# Patient Record
Sex: Male | Born: 1990 | Race: Black or African American | Hispanic: No | Marital: Single | Smoking: Former smoker
Health system: Southern US, Community
[De-identification: ages and names within clinical notes are randomized; demographics above are authoritative.]

---

## 2018-01-23 ENCOUNTER — Emergency Department: Payer: Self-pay

## 2018-01-23 ENCOUNTER — Encounter: Payer: Self-pay | Admitting: Emergency Medicine

## 2018-01-23 ENCOUNTER — Emergency Department
Admission: EM | Admit: 2018-01-23 | Discharge: 2018-01-23 | Disposition: A | Discharge status: Home / Self Care | Payer: Self-pay | Attending: Emergency Medicine | Admitting: Emergency Medicine

## 2018-01-23 DIAGNOSIS — R079 Chest pain, unspecified: Secondary | ICD-10-CM

## 2018-01-23 DIAGNOSIS — R197 Diarrhea, unspecified: Secondary | ICD-10-CM

## 2018-01-23 DIAGNOSIS — R369 Urethral discharge, unspecified: Secondary | ICD-10-CM | POA: Insufficient documentation

## 2018-01-23 DIAGNOSIS — R0789 Other chest pain: Secondary | ICD-10-CM | POA: Insufficient documentation

## 2018-01-23 DIAGNOSIS — Z87891 Personal history of nicotine dependence: Secondary | ICD-10-CM | POA: Insufficient documentation

## 2018-01-23 LAB — TROPONIN I

## 2018-01-23 LAB — CBC WITH DIFFERENTIAL/PLATELET
BASOS ABS: 0 10*3/uL (ref 0–0.1)
Basophils Relative: 1 %
EOS PCT: 1 %
Eosinophils Absolute: 0 10*3/uL (ref 0–0.7)
HEMATOCRIT: 43 % (ref 40.0–52.0)
Hemoglobin: 13.7 g/dL (ref 13.0–18.0)
LYMPHS PCT: 27 %
Lymphs Abs: 0.8 10*3/uL — ABNORMAL LOW (ref 1.0–3.6)
MCH: 23.9 pg — AB (ref 26.0–34.0)
MCHC: 31.9 g/dL — ABNORMAL LOW (ref 32.0–36.0)
MCV: 74.8 fL — AB (ref 80.0–100.0)
Monocytes Absolute: 0.5 10*3/uL (ref 0.2–1.0)
Monocytes Relative: 16 %
NEUTROS ABS: 1.6 10*3/uL (ref 1.4–6.5)
Neutrophils Relative %: 55 %
PLATELETS: 196 10*3/uL (ref 150–440)
RBC: 5.75 MIL/uL (ref 4.40–5.90)
RDW: 14.2 % (ref 11.5–14.5)
WBC: 2.9 10*3/uL — AB (ref 3.8–10.6)

## 2018-01-23 LAB — URINALYSIS, COMPLETE (UACMP) WITH MICROSCOPIC
BACTERIA UA: NONE SEEN
Bilirubin Urine: NEGATIVE
GLUCOSE, UA: NEGATIVE mg/dL
HGB URINE DIPSTICK: NEGATIVE
Ketones, ur: NEGATIVE mg/dL
Leukocytes, UA: NEGATIVE
NITRITE: NEGATIVE
PROTEIN: 30 mg/dL — AB
SPECIFIC GRAVITY, URINE: 1.031 — AB (ref 1.005–1.030)
pH: 6 (ref 5.0–8.0)

## 2018-01-23 LAB — CHLAMYDIA/NGC RT PCR (ARMC ONLY)
CHLAMYDIA TR: NOT DETECTED
N gonorrhoeae: NOT DETECTED

## 2018-01-23 LAB — COMPREHENSIVE METABOLIC PANEL
ALBUMIN: 4.4 g/dL (ref 3.5–5.0)
ALK PHOS: 59 U/L (ref 38–126)
ALT: 16 U/L — AB (ref 17–63)
AST: 26 U/L (ref 15–41)
Anion gap: 8 (ref 5–15)
BILIRUBIN TOTAL: 1.1 mg/dL (ref 0.3–1.2)
BUN: 16 mg/dL (ref 6–20)
CALCIUM: 9.5 mg/dL (ref 8.9–10.3)
CO2: 26 mmol/L (ref 22–32)
CREATININE: 0.96 mg/dL (ref 0.61–1.24)
Chloride: 103 mmol/L (ref 101–111)
GFR calc Af Amer: >60 mL/min (ref >60)
GLUCOSE: 97 mg/dL (ref 65–99)
Potassium: 3.8 mmol/L (ref 3.5–5.1)
Sodium: 137 mmol/L (ref 135–145)
TOTAL PROTEIN: 8.2 g/dL — AB (ref 6.5–8.1)

## 2018-01-23 LAB — LIPASE, BLOOD: Lipase: 25 U/L (ref 11–51)

## 2018-01-23 MED ORDER — DIAZEPAM 2 MG PO TABS
2.0000 mg | ORAL_TABLET | Freq: Once | ORAL | Status: AC
  Administered 2018-01-23: 2 mg via ORAL

## 2018-01-23 MED ORDER — DIAZEPAM 2 MG PO TABS
ORAL_TABLET | ORAL | Status: AC
  Filled 2018-01-23: qty 1

## 2018-01-23 NOTE — ED Notes (Signed)
Patient transported to X-ray 

## 2018-01-23 NOTE — ED Triage Notes (Addendum)
Patient presents to ED via POV from home with multiple complaints. Patient reports recently being treated for an STD. Patient reports being prescribed doxycycline for 2 weeks but he only took 1 week worth. Patient also reports chest tenderness on palpation. Patient reports diarrhea as well. Pressured speech noted in triage. Patient also requesting a "colon check" because colon cancer runs in his family. Patient reports "I think I am having a stroke". NIH 0. VAN negative. Negative FAST exam. Even and non labored respirations noted. A&O x4.

## 2018-01-23 NOTE — ED Notes (Signed)
Patient presentation discussed with Don Perking MD. Patient is stable for flex care at this time.

## 2018-01-23 NOTE — ED Provider Notes (Signed)
[facility] Emergency Department Provider Note  ___________________________________________   First MD Initiated Contact with Patient 01/23/18 1401     (approximate)  I have reviewed the triage vital signs and the nursing notes.   HISTORY  Chief Complaint Other (Multiple Complaints)   HPI Bernard Jackson is a 27 y.o. male without any chronic medical issues who is presenting to the emergency department with several weeks of left-sided chest pain, diarrhea as well as penile discharge.  He says that would brought him in today was the left-sided chest pain which she describes as left upper outer portion of his chest and worse with movement.  He denies any nausea, vomiting or shortness of breath.  He says the pain comes and goes with anxiety.  He is here with his girlfriend he says that she sometimes will receive calls from him crying because of pain" getting worked up."  Patient says that he is already been to [facility] and was given doxycycline as well as Colace.  He says that he took the doxycycline for approximately 10 days and the penile discharge went away but did not complete the full 14-day course.  Says that he also has been having ongoing diarrhea over the past week and had a small streak of blood last week but no blood since.  Says that he is having loose, brown stool once a day.  Says that his grandmother had heart failure when she was 30.  Otherwise, no heart disease in the family or sudden death at a young age.  Patient says that he smokes marijuana daily but otherwise does not smoke cigarettes.  No other drinking or drug use.  History reviewed. No pertinent past medical history.  There are no active problems to display for this patient.   History reviewed. No pertinent surgical history.  Prior to Admission medications   Not on File    Allergies Patient has no known allergies.  No family history on file.  Social History Social History    Tobacco Use  . Smoking status: Former Smoker    Last attempt to quit: 11/23/2017    Years since quitting: 0.1  . Smokeless tobacco: Never Used  Substance Use Topics  . Alcohol use: Yes    Comment: "Not every day. 2x a week and on the weekends"  . Drug use: Yes    Types: Marijuana    Review of Systems  Constitutional: No fever/chills Eyes: No visual changes. ENT: No sore throat. Cardiovascular: As above Respiratory: Denies shortness of breath. Gastrointestinal: No abdominal pain.  No nausea, no vomiting. No constipation. Genitourinary: As above Musculoskeletal: Negative for back pain. Skin: Negative for rash. Neurological: Negative for headaches, focal weakness or numbness.   ____________________________________________   PHYSICAL EXAM:  VITAL SIGNS: ED Triage Vitals [01/23/18 1318]  Enc Vitals Group     BP (!) 155/96     Pulse Rate 92     Resp 16     Temp 99.7 F (37.6 C)     Temp Source Oral     SpO2 100 %     Weight 155 lb (70.3 kg)     Height  (1.727 m)     Head Circumference      Peak Flow      Pain Score      Pain Loc      Pain Edu?      Excl. in GC?     Constitutional: Alert and oriented. Well appearing and in  no acute distress.  Patient with pressured speech and becomes tearful during the exam. Eyes: Conjunctivae are normal.  Head: Atraumatic. Nose: No congestion/rhinnorhea. Mouth/Throat: Mucous membranes are moist.  Neck: No stridor.   Cardiovascular: Normal rate, regular rhythm. Grossly normal heart sounds.  Good peripheral circulation with equal and bilateral radial pulses.  Minimal tenderness to palpation to the left outer upper quadrant of the left pectoralis major muscle. Respiratory: Normal respiratory effort.  No retractions. Lungs CTAB. Gastrointestinal: Soft and nontender. No distention. No CVA tenderness. Musculoskeletal: No lower extremity tenderness nor edema.  No joint effusions. Neurologic:  Normal speech and language. No  gross focal neurologic deficits are appreciated. Skin:  Skin is warm, dry and intact. No rash noted. Psychiatric: Mood and affect are normal. Speech and behavior are normal.  ____________________________________________   LABS (all labs ordered are listed, but only abnormal results are displayed)  Labs Reviewed  URINALYSIS, COMPLETE (UACMP) WITH MICROSCOPIC - Abnormal; Notable for the following components:      Result Value   Color, Urine YELLOW (*)    APPearance CLEAR (*)    Specific Gravity, Urine 1.031 (*)    Protein, ur 30 (*)    All other components within normal limits  CBC WITH DIFFERENTIAL/PLATELET - Abnormal; Notable for the following components:   WBC 2.9 (*)    MCV 74.8 (*)    MCH 23.9 (*)    MCHC 31.9 (*)    Lymphs Abs 0.8 (*)    All other components within normal limits  CHLAMYDIA/NGC RT PCR ([facility] ONLY)  LIPASE, BLOOD  COMPREHENSIVE METABOLIC PANEL  TROPONIN I   ____________________________________________  EKG  ED ECG REPORT I, Arelia Longest, the attending physician, personally viewed and interpreted this ECG.   Date: 01/23/2018  EKG Time: 1418  Rate: 71  Rhythm: normal sinus rhythm  Axis: Normal  Intervals:none  ST&T Change: No ST segment elevation or depression.  No abnormal T wave inversion.  ____________________________________________  RADIOLOGY  No acute finding on the chest x-ray ____________________________________________   PROCEDURES  Procedure(s) performed:   Procedures  Critical Care performed:   ____________________________________________   INITIAL IMPRESSION / ASSESSMENT AND PLAN / ED COURSE  Pertinent labs & imaging results that were available during my care of the patient were reviewed by me and considered in my medical decision making (see chart for details).  Differential diagnosis includes, but is not limited to, ACS, aortic dissection, pulmonary embolism, cardiac tamponade, pneumothorax, pneumonia,  pericarditis, myocarditis, GI-related causes including esophagitis/gastritis, and musculoskeletal chest wall pain.   As part of my medical decision making, I reviewed the following data within the electronic MEDICAL RECORD NUMBERNo previous records on file for review.  Attempted to connect via care everywhere but care everywhere unable to find a matching record.  PE RC negative.  ----------------------------------------- 3:19 PM on 01/23/2018 -----------------------------------------  Patient resting without distress at this time.  Very reassuring work-up.  Ongoing symptoms for weeks to months.  I do not believe there is a life-threatening condition at this time.  Believe the patient follow-up in the office with a primary care doctor.  We discussed this as well as that his urine looks clear at this time.  He knows that he will receive a call if the gonorrhea or chlamydia comes back positive.  No further medications at this time.  He appears more calm since having the Valium.  No longer tearful.  Calm and cooperative.  Did not express any suicidal or homicidal ideation.  ____________________________________________   FINAL CLINICAL IMPRESSION(S) / ED DIAGNOSES  Chest pain.  Diarrhea.    NEW MEDICATIONS STARTED DURING THIS VISIT:  New Prescriptions   No medications on file     Note:  This document was prepared using Dragon voice recognition software and may include unintentional dictation errors.     Myrna Blazer, MD 01/23/18 1520

## 2018-01-23 NOTE — ED Notes (Signed)
First Nurse Note: Has had a chest pain for three weeks, He went to [facility] and they gave him medication it caused him to have diarrhea so he stopped taking it. States that his chest is still hurting.

## 2018-03-05 ENCOUNTER — Emergency Department
Admission: EM | Admit: 2018-03-05 | Discharge: 2018-03-05 | Disposition: A | Discharge status: Home / Self Care | Payer: Self-pay | Attending: Emergency Medicine | Admitting: Emergency Medicine

## 2018-03-05 ENCOUNTER — Other Ambulatory Visit: Payer: Self-pay

## 2018-03-05 ENCOUNTER — Encounter: Payer: Self-pay | Admitting: Emergency Medicine

## 2018-03-05 ENCOUNTER — Emergency Department: Payer: Self-pay

## 2018-03-05 DIAGNOSIS — R369 Urethral discharge, unspecified: Secondary | ICD-10-CM

## 2018-03-05 DIAGNOSIS — R079 Chest pain, unspecified: Secondary | ICD-10-CM | POA: Insufficient documentation

## 2018-03-05 DIAGNOSIS — Z87891 Personal history of nicotine dependence: Secondary | ICD-10-CM | POA: Insufficient documentation

## 2018-03-05 DIAGNOSIS — R360 Urethral discharge without blood: Secondary | ICD-10-CM | POA: Insufficient documentation

## 2018-03-05 LAB — CHLAMYDIA/NGC RT PCR (ARMC ONLY)
CHLAMYDIA TR: NOT DETECTED
N GONORRHOEAE: NOT DETECTED

## 2018-03-05 LAB — BASIC METABOLIC PANEL
ANION GAP: 9 (ref 5–15)
BUN: 18 mg/dL (ref 6–20)
CHLORIDE: 102 mmol/L (ref 101–111)
CO2: 26 mmol/L (ref 22–32)
Calcium: 10.1 mg/dL (ref 8.9–10.3)
Creatinine, Ser: 1.22 mg/dL (ref 0.61–1.24)
Glucose, Bld: 105 mg/dL — ABNORMAL HIGH (ref 65–99)
POTASSIUM: 4.5 mmol/L (ref 3.5–5.1)
SODIUM: 137 mmol/L (ref 135–145)

## 2018-03-05 LAB — URINALYSIS, COMPLETE (UACMP) WITH MICROSCOPIC
BACTERIA UA: NONE SEEN
BILIRUBIN URINE: NEGATIVE
Glucose, UA: NEGATIVE mg/dL
Hgb urine dipstick: NEGATIVE
Ketones, ur: NEGATIVE mg/dL
LEUKOCYTES UA: NEGATIVE
NITRITE: NEGATIVE
Protein, ur: NEGATIVE mg/dL
SPECIFIC GRAVITY, URINE: 1.027 (ref 1.005–1.030)
pH: 6 (ref 5.0–8.0)

## 2018-03-05 LAB — CBC
HEMATOCRIT: 45 % (ref 40.0–52.0)
Hemoglobin: 14.3 g/dL (ref 13.0–18.0)
MCH: 23.7 pg — ABNORMAL LOW (ref 26.0–34.0)
MCHC: 31.8 g/dL — ABNORMAL LOW (ref 32.0–36.0)
MCV: 74.7 fL — AB (ref 80.0–100.0)
PLATELETS: 227 10*3/uL (ref 150–440)
RBC: 6.02 MIL/uL — AB (ref 4.40–5.90)
RDW: 14.7 % — AB (ref 11.5–14.5)
WBC: 3.2 10*3/uL — AB (ref 3.8–10.6)

## 2018-03-05 LAB — TROPONIN I: Troponin I: <0.03 ng/mL (ref <0.03)

## 2018-03-05 NOTE — ED Notes (Signed)
Family member came out to nurses desk asking about wait. Appeared very disgruntled they have been in ED for so long. Informed her that one test has not resulted yet so that is what the doctor is waiting on. She walked back into room and cursing and yelling heard from room.

## 2018-03-05 NOTE — Discharge Instructions (Addendum)
Please make an appointment with a urologist for continued evaluation of your penis discharge.  Please make a primary care physician appointment for evaluation of your chest pain.  Return to the emergency department if you develop severe pain, shortness of breath, fever or chills, or any other symptoms concerning to you.

## 2018-03-05 NOTE — ED Provider Notes (Signed)
[facility] Emergency Department Provider Note  ____________________________________________  Time seen: Approximately 5:37 PM  I have reviewed the triage vital signs and the nursing notes.   HISTORY  Chief Complaint Penile Discharge and Chest Pain   HPI Bernard Jackson is a 27 y.o. male, otherwise healthy, presenting with penile discharge, chest pain.  The patient reports that for the past several months, he has had an intermittent penile discharge with dysuria and pain with ejaculation.  He has been treated for gonorrhea and chlamydia, multiple times and feels that his symptoms have improved but that he continues to have a penile discharge.  He reports that he looks at his penis after he urinates every single time and is able to see a drop on the tip of the urethra.  He denies any testicular scrotal pain, scrotal or testicular swelling, fevers or chills.  He reports that he has had multiple sexual partners in the past, but that everyone has been treated for gonorrhea and chlamydia as well, and that at this time he is only sexually active with a single person.  He also reports a daily left-sided chest pain without any radiation, palpitations, shortness of breath, diaphoresis, nausea or vomiting.  The patient has not seen a primary care physician or a urologist for his symptoms.  No past medical history on file.  There are no active problems to display for this patient.   History reviewed. No pertinent surgical history.    Allergies Patient has no known allergies.  No family history on file.  Social History Social History   Tobacco Use  . Smoking status: Former Smoker    Last attempt to quit: 11/23/2017    Years since quitting: 0.2  . Smokeless tobacco: Never Used  Substance Use Topics  . Alcohol use: Yes    Comment: "Not every day. 2x a week and on the weekends"  . Drug use: Yes    Types: Marijuana    Review of Systems Constitutional: No  fever/chills.  No lightheadedness or syncope. Eyes: No visual changes. ENT: No sore throat. No congestion or rhinorrhea. Cardiovascular: Positive chest pain. Denies palpitations. Respiratory: Denies shortness of breath.  No cough. Gastrointestinal: No abdominal pain.  No nausea, no vomiting.  No diarrhea.  No constipation. Genitourinary: Positive for penile discharge, dysuria, and pain with ejaculation.  Denies any hematuria or bloody ejaculate.  Denies any testicular or scrotal swelling or pain. Musculoskeletal: Negative for back pain. Skin: Negative for rash. Neurological: Negative for headaches. No focal numbness, tingling or weakness.     ____________________________________________   PHYSICAL EXAM:  VITAL SIGNS: ED Triage Vitals  Enc Vitals Group     BP 03/05/18 1335 (!) 151/107     Pulse Rate 03/05/18 1335 95     Resp 03/05/18 1335 20     Temp 03/05/18 1335 98.1 F (36.7 C)     Temp Source 03/05/18 1335 Oral     SpO2 03/05/18 1335 99 %     Weight 03/05/18 1337 150 lb (68 kg)     Height 03/05/18 1337 5\' 7"  (1.702 m)     Head Circumference --      Peak Flow --      Pain Score 03/05/18 1337 8     Pain Loc --      Pain Edu? --      Excl. in GC? --     Constitutional: Alert and oriented. Well appearing and in no acute distress. Answers questions appropriately. Eyes: Conjunctivae  are normal.  EOMI. No scleral icterus. Head: Atraumatic. Nose: No congestion/rhinnorhea. Mouth/Throat: Mucous membranes are moist.  Neck: No stridor.  Supple.  No JVD.  No meningismus. Cardiovascular: Normal rate, regular rhythm. No murmurs, rubs or gallops.  Respiratory: Normal respiratory effort.  No accessory muscle use or retractions. Lungs CTAB.  No wheezes, rales or ronchi. Gastrointestinal: Soft, nontender and nondistended.  No guarding or rebound.  No peritoneal signs. Genitourinary: Normal-appearing penis without any lesions.  The patient "Milks" his penis to show me discharge but I  can see a very minimal amount of what is likely urine in the urethral canal; there is no thick or discolored discharge.  The patient has normal lie of the testicles and scrotum, without any palpable masses or tenderness to palpation.  No inguinal hernias. Musculoskeletal: No LE edema. Neurologic:  A&Ox3.  Speech is clear.  Face and smile are symmetric.  EOMI.  Moves all extremities well. Skin:  Skin is warm, dry and intact. No rash noted. Psychiatric: The patient has a bizarre affect and a mildly depressed mood with occasional tearfulness.  ____________________________________________   LABS (all labs ordered are listed, but only abnormal results are displayed)  Labs Reviewed  BASIC METABOLIC PANEL - Abnormal; Notable for the following components:      Result Value   Glucose, Bld 105 (*)    All other components within normal limits  CBC - Abnormal; Notable for the following components:   WBC 3.2 (*)    RBC 6.02 (*)    MCV 74.7 (*)    MCH 23.7 (*)    MCHC 31.8 (*)    RDW 14.7 (*)    All other components within normal limits  CHLAMYDIA/NGC RT PCR ([facility] ONLY)  TROPONIN I  RPR  URINALYSIS, COMPLETE (UACMP) WITH MICROSCOPIC   ____________________________________________  EKG  ED ECG REPORT I, Rockne MenghiniNorman, Anne-Caroline, the attending physician, personally viewed and interpreted this ECG.   Date: 03/05/2018  EKG Time: 1347  Rate: 61  Rhythm: normal sinus rhythm; incomplete right bundle branch block.  Axis: normal  Intervals:none  ST&T Change: no STEMI  This EKG is compared to 01/24/2018 and is grossly unchanged  ____________________________________________  RADIOLOGY  Dg Chest 2 View  Result Date: 03/05/2018 CLINICAL DATA:  Chest discomfort for 2 months. EXAM: CHEST - 2 VIEW COMPARISON:  January 23, 2017 FINDINGS: No pneumothorax. The heart, hila, and mediastinum are normal. No pulmonary nodules or masses. No focal infiltrates. IMPRESSION: No active cardiopulmonary  disease. Electronically Signed   By: Gerome Samavid  Williams III M.D   On: 03/05/2018 14:47    ____________________________________________   PROCEDURES  Procedure(s) performed: None  Procedures  Critical Care performed: No ____________________________________________   INITIAL IMPRESSION / ASSESSMENT AND PLAN / ED COURSE  Pertinent labs & imaging results that were available during my care of the patient were reviewed by me and considered in my medical decision making (see chart for details).  27 y.o. male presenting for penile discharge and chest pain.  Overall, it is unclear that the patient has an active urethral infection, and his symptoms are not consistent with prostatitis.  Will check for gonorrhea and chlamydia, but if he is negative, will no treatment is warranted at this time.  We will also evaluate for UTI.  An RPR will be sent and will likely need to be followed up as an outpatient.  The patient's chest pain is nonspecific, his EKG is grossly unchanged, and his troponin is not elevated.  His chest x-ray  does not show any acute cardiopulmonary process.  The etiology of the patient's chest pain is unclear, but there is no evidence for ACS or MI, arrhythmia, today.  I have talked to the patient about PCP follow-up as well as urology follow-up.  He is concerned about his lack of insurance and I will give him the numbers for the clinics which see patients that do not have medical insurance.  ____________________________________________  FINAL CLINICAL IMPRESSION(S) / ED DIAGNOSES  Final diagnoses:  Penile discharge, without blood  Chest pain, unspecified type         NEW MEDICATIONS STARTED DURING THIS VISIT:  New Prescriptions   No medications on file      Rockne Menghini, MD 03/05/18 1801

## 2018-03-05 NOTE — ED Notes (Signed)
Pt states he was recently treated for STDs and took all prescribed medications. Pt states he still has discharge and is hurting all over. Pt thinks he may have prostate cancer and is concerned.

## 2018-03-05 NOTE — ED Notes (Signed)
Esign not working at this time. Pt verbalized discharge instructions and all questions answered

## 2018-03-05 NOTE — ED Triage Notes (Addendum)
Clear or white penile discharge x 1 month. Denies burning. During triage also states he has L chest pain since yesterday.

## 2018-03-06 LAB — RPR: RPR Ser Ql: NONREACTIVE

## 2018-10-18 ENCOUNTER — Emergency Department
Admission: EM | Admit: 2018-10-18 | Discharge: 2018-10-18 | Disposition: A | Discharge status: Home / Self Care | Payer: Self-pay | Attending: Emergency Medicine | Admitting: Emergency Medicine

## 2018-10-18 ENCOUNTER — Other Ambulatory Visit: Payer: Self-pay

## 2018-10-18 DIAGNOSIS — R1110 Vomiting, unspecified: Secondary | ICD-10-CM

## 2018-10-18 DIAGNOSIS — R109 Unspecified abdominal pain: Secondary | ICD-10-CM | POA: Insufficient documentation

## 2018-10-18 DIAGNOSIS — R112 Nausea with vomiting, unspecified: Secondary | ICD-10-CM | POA: Insufficient documentation

## 2018-10-18 DIAGNOSIS — Z87891 Personal history of nicotine dependence: Secondary | ICD-10-CM | POA: Insufficient documentation

## 2018-10-18 LAB — COMPREHENSIVE METABOLIC PANEL
ALT: 32 U/L (ref 0–44)
AST: 34 U/L (ref 15–41)
Albumin: 4.7 g/dL (ref 3.5–5.0)
Alkaline Phosphatase: 73 U/L (ref 38–126)
Anion gap: 10 (ref 5–15)
BUN: 22 mg/dL — ABNORMAL HIGH (ref 6–20)
CO2: 23 mmol/L (ref 22–32)
Calcium: 9.6 mg/dL (ref 8.9–10.3)
Chloride: 105 mmol/L (ref 98–111)
Creatinine, Ser: 1.17 mg/dL (ref 0.61–1.24)
GFR calc non Af Amer: >60 mL/min (ref >60)
Glucose, Bld: 110 mg/dL — ABNORMAL HIGH (ref 70–99)
Potassium: 4.1 mmol/L (ref 3.5–5.1)
Sodium: 138 mmol/L (ref 135–145)
Total Bilirubin: 1.1 mg/dL (ref 0.3–1.2)
Total Protein: 8 g/dL (ref 6.5–8.1)

## 2018-10-18 LAB — CBC WITH DIFFERENTIAL/PLATELET
ABS IMMATURE GRANULOCYTES: 0.08 10*3/uL — AB (ref 0.00–0.07)
Basophils Absolute: 0 10*3/uL (ref 0.0–0.1)
Basophils Relative: 0 %
Eosinophils Absolute: 0 10*3/uL (ref 0.0–0.5)
Eosinophils Relative: 0 %
HCT: 44 % (ref 39.0–52.0)
Hemoglobin: 13.4 g/dL (ref 13.0–17.0)
Immature Granulocytes: 1 %
Lymphocytes Relative: 9 %
Lymphs Abs: 0.9 10*3/uL (ref 0.7–4.0)
MCH: 22.6 pg — ABNORMAL LOW (ref 26.0–34.0)
MCHC: 30.5 g/dL (ref 30.0–36.0)
MCV: 74.2 fL — ABNORMAL LOW (ref 80.0–100.0)
Monocytes Absolute: 0.7 10*3/uL (ref 0.1–1.0)
Monocytes Relative: 7 %
NRBC: 0 % (ref 0.0–0.2)
Neutro Abs: 8.2 10*3/uL — ABNORMAL HIGH (ref 1.7–7.7)
Neutrophils Relative %: 83 %
Platelets: 205 10*3/uL (ref 150–400)
RBC: 5.93 MIL/uL — ABNORMAL HIGH (ref 4.22–5.81)
RDW: 14.2 % (ref 11.5–15.5)
WBC: 9.9 10*3/uL (ref 4.0–10.5)

## 2018-10-18 MED ORDER — DICYCLOMINE HCL 10 MG PO CAPS
20.0000 mg | ORAL_CAPSULE | Freq: Once | ORAL | Status: AC
  Administered 2018-10-18: 20 mg via ORAL
  Filled 2018-10-18: qty 2

## 2018-10-18 MED ORDER — FAMOTIDINE 20 MG PO TABS: 20.0000 mg | ORAL_TABLET | Freq: Every day | ORAL | 1 refills | Status: AC

## 2018-10-18 MED ORDER — SODIUM CHLORIDE 0.9 % IV BOLUS
1000.0000 mL | Freq: Once | INTRAVENOUS | Status: AC
  Administered 2018-10-18: 1000 mL via INTRAVENOUS

## 2018-10-18 MED ORDER — ONDANSETRON HCL 4 MG/2ML IJ SOLN
4.0000 mg | Freq: Once | INTRAMUSCULAR | Status: AC
  Administered 2018-10-18: 4 mg via INTRAVENOUS
  Filled 2018-10-18: qty 2

## 2018-10-18 MED ORDER — DICYCLOMINE HCL 20 MG PO TABS: 20.0000 mg | ORAL_TABLET | Freq: Three times a day (TID) | ORAL | 0 refills | Status: DC | PRN

## 2018-10-18 MED ORDER — SUCRALFATE 1 G PO TABS: 1.0000 g | ORAL_TABLET | Freq: Four times a day (QID) | ORAL | 0 refills | Status: AC

## 2018-10-18 NOTE — ED Notes (Signed)
Grn/Lav/Red tubes sent to lab.

## 2018-10-18 NOTE — ED Provider Notes (Signed)
[facility] Emergency Department Provider Note  ____________________________________________   I have reviewed the triage vital signs and the nursing notes.   HISTORY  Chief Complaint Emesis and Hematemesis   History limited by: Not Limited   HPI Rydge Greig is a 28 y.o. male who presents to the emergency department today via EMS because of concerns for continued abdominal pain, nausea vomiting and diarrhea.  Patient states that he has had issues with his stomach for months and months.  He has seen a GI specialist for this and has had multiple visits to the emergency department.  He states he came in today because he noticed some blood in his stool.  He states this has happened to him before.  He is also noticed blood in his vomit from time to time.  The patient states he is not currently taking any medications for his abdominal discomfort.  He denies any fevers.  Per medical record review patient has a history of ER visits in the past for similar symptoms  History reviewed. No pertinent past medical history.  There are no active problems to display for this patient.   History reviewed. No pertinent surgical history.  Prior to Admission medications   Not on File    Allergies Patient has no known allergies.  History reviewed. No pertinent family history.  Social History Social History   Tobacco Use  . Smoking status: Former Smoker    Last attempt to quit: 11/23/2017    Years since quitting: 0.9  . Smokeless tobacco: Never Used  Substance Use Topics  . Alcohol use: Yes    Comment: "Not every day. 2x a week and on the weekends"  . Drug use: Yes    Types: Marijuana    Review of Systems Constitutional: No fever/chills Eyes: No visual changes. ENT: No sore throat. Cardiovascular: Denies chest pain. Respiratory: Denies shortness of breath. Gastrointestinal: Positive for abdominal pain, nausea, vomiting and diarrhea Genitourinary: Negative  for dysuria. Musculoskeletal: Negative for back pain. Skin: Negative for rash. Neurological: Negative for headaches, focal weakness or numbness.  ____________________________________________   PHYSICAL EXAM:  VITAL SIGNS: ED Triage Vitals  Enc Vitals Group     BP 10/18/18 1800 (!) 113/92     Pulse Rate 10/18/18 1800 73     Resp 10/18/18 1816 16     Temp 10/18/18 1800 99.7 F (37.6 C)     Temp Source 10/18/18 1800 Oral     SpO2 10/18/18 1757 99 %     Weight 10/18/18 1805 195 lb (88.5 kg)     Height 10/18/18 1805 5\' 7"  (1.702 m)     Head Circumference --      Peak Flow --      Pain Score 10/18/18 1802 10   Constitutional: Alert and oriented.  Eyes: Conjunctivae are normal.  ENT      Head: Normocephalic and atraumatic.      Nose: No congestion/rhinnorhea.      Mouth/Throat: Mucous membranes are moist.      Neck: No stridor. Hematological/Lymphatic/Immunilogical: No cervical lymphadenopathy. Cardiovascular: Normal rate, regular rhythm.  No murmurs, rubs, or gallops.  Respiratory: Normal respiratory effort without tachypnea nor retractions. Breath sounds are clear and equal bilaterally. No wheezes/rales/rhonchi. Gastrointestinal: Soft and non tender. No rebound. No guarding.  Genitourinary: Deferred Musculoskeletal: Normal range of motion in all extremities. No lower extremity edema. Neurologic:  Normal speech and language. No gross focal neurologic deficits are appreciated.  Skin:  Skin is warm, dry and  intact. No rash noted. Psychiatric: Mood and affect are normal. Speech and behavior are normal. Patient exhibits appropriate insight and judgment.  ____________________________________________    LABS (pertinent positives/negatives)  CMP wnl except glu 110, BUn 22 CBC wbc 9.9, hgb 13.4, plt 205  ____________________________________________   EKG  None  ____________________________________________     RADIOLOGY  None   ____________________________________________   PROCEDURES  Procedures  ____________________________________________   INITIAL IMPRESSION / ASSESSMENT AND PLAN / ED COURSE  Pertinent labs & imaging results that were available during my care of the patient were reviewed by me and considered in my medical decision making (see chart for details).   Patient presented to the emergency department today because of concerns for nausea vomiting and diarrhea he did notice some blood in this.  Patient states he has a history of IBS.  Patient's blood work today without concerning leukocytosis or anemia.  He did feel somewhat better after medications.  At this point I do think is reasonable for patient be discharged home.  Discussed with patient's importance of continued follow-up with his GI doctor.   ____________________________________________   FINAL CLINICAL IMPRESSION(S) / ED DIAGNOSES  Final diagnoses:  Vomiting, intractability of vomiting not specified, presence of nausea not specified, unspecified vomiting type  Abdominal pain, unspecified abdominal location     Note: This dictation was prepared with Dragon dictation. Any transcriptional errors that result from this process are unintentional     Phineas SemenGoodman, Emarion Toral, MD 10/19/18 1730

## 2018-10-18 NOTE — ED Notes (Addendum)
Pt states recently experiencing episodes of pain when swallowing and weight on his chest. EKG completed. Pt denies heavy alcohol use.

## 2018-10-18 NOTE — ED Triage Notes (Signed)
Pt in via Ems from home d/t bloody N/V x3 and bloody stool. Given 4 of Zofran by EMS through 20g R ac.

## 2018-10-18 NOTE — Discharge Instructions (Addendum)
Please seek medical attention for any high fevers, chest pain, shortness of breath, change in behavior, persistent vomiting, bloody stool or any other new or concerning symptoms.  

## 2019-02-19 ENCOUNTER — Other Ambulatory Visit: Payer: Self-pay

## 2019-02-19 ENCOUNTER — Emergency Department: Admission: EM | Admit: 2019-02-19 | Discharge: 2019-02-19 | Discharge status: Absent without leave | Payer: Self-pay

## 2019-12-28 IMAGING — CR DG CHEST 2V
1 series · 2 of 2 positions shown · non-contrast
Comparison: January 23, 2017

CLINICAL DATA: Chest discomfort for 2 months.

EXAM:
CHEST - 2 VIEW

[Series 1: dg chest 2 view · 0.14mm/px · 2 of 2 slices shown]
[im 1/2]
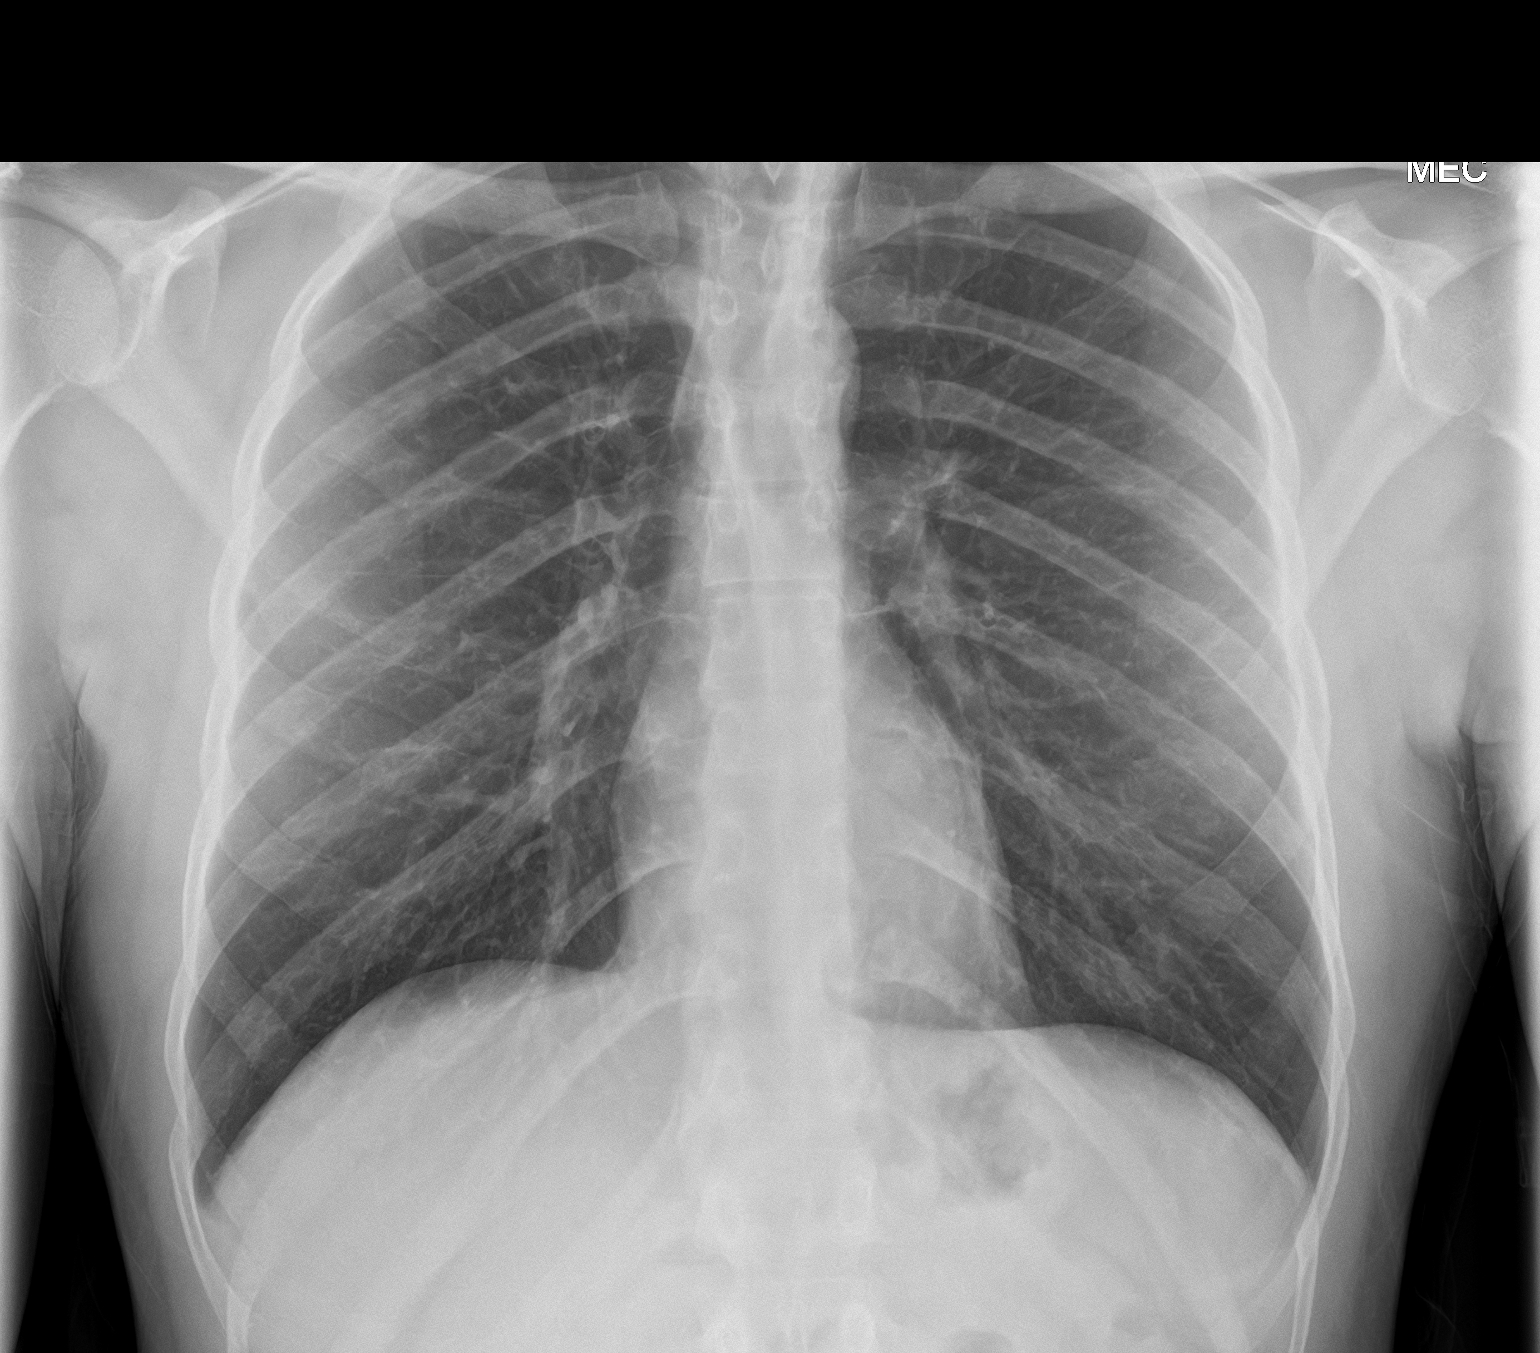
[im 2/2]
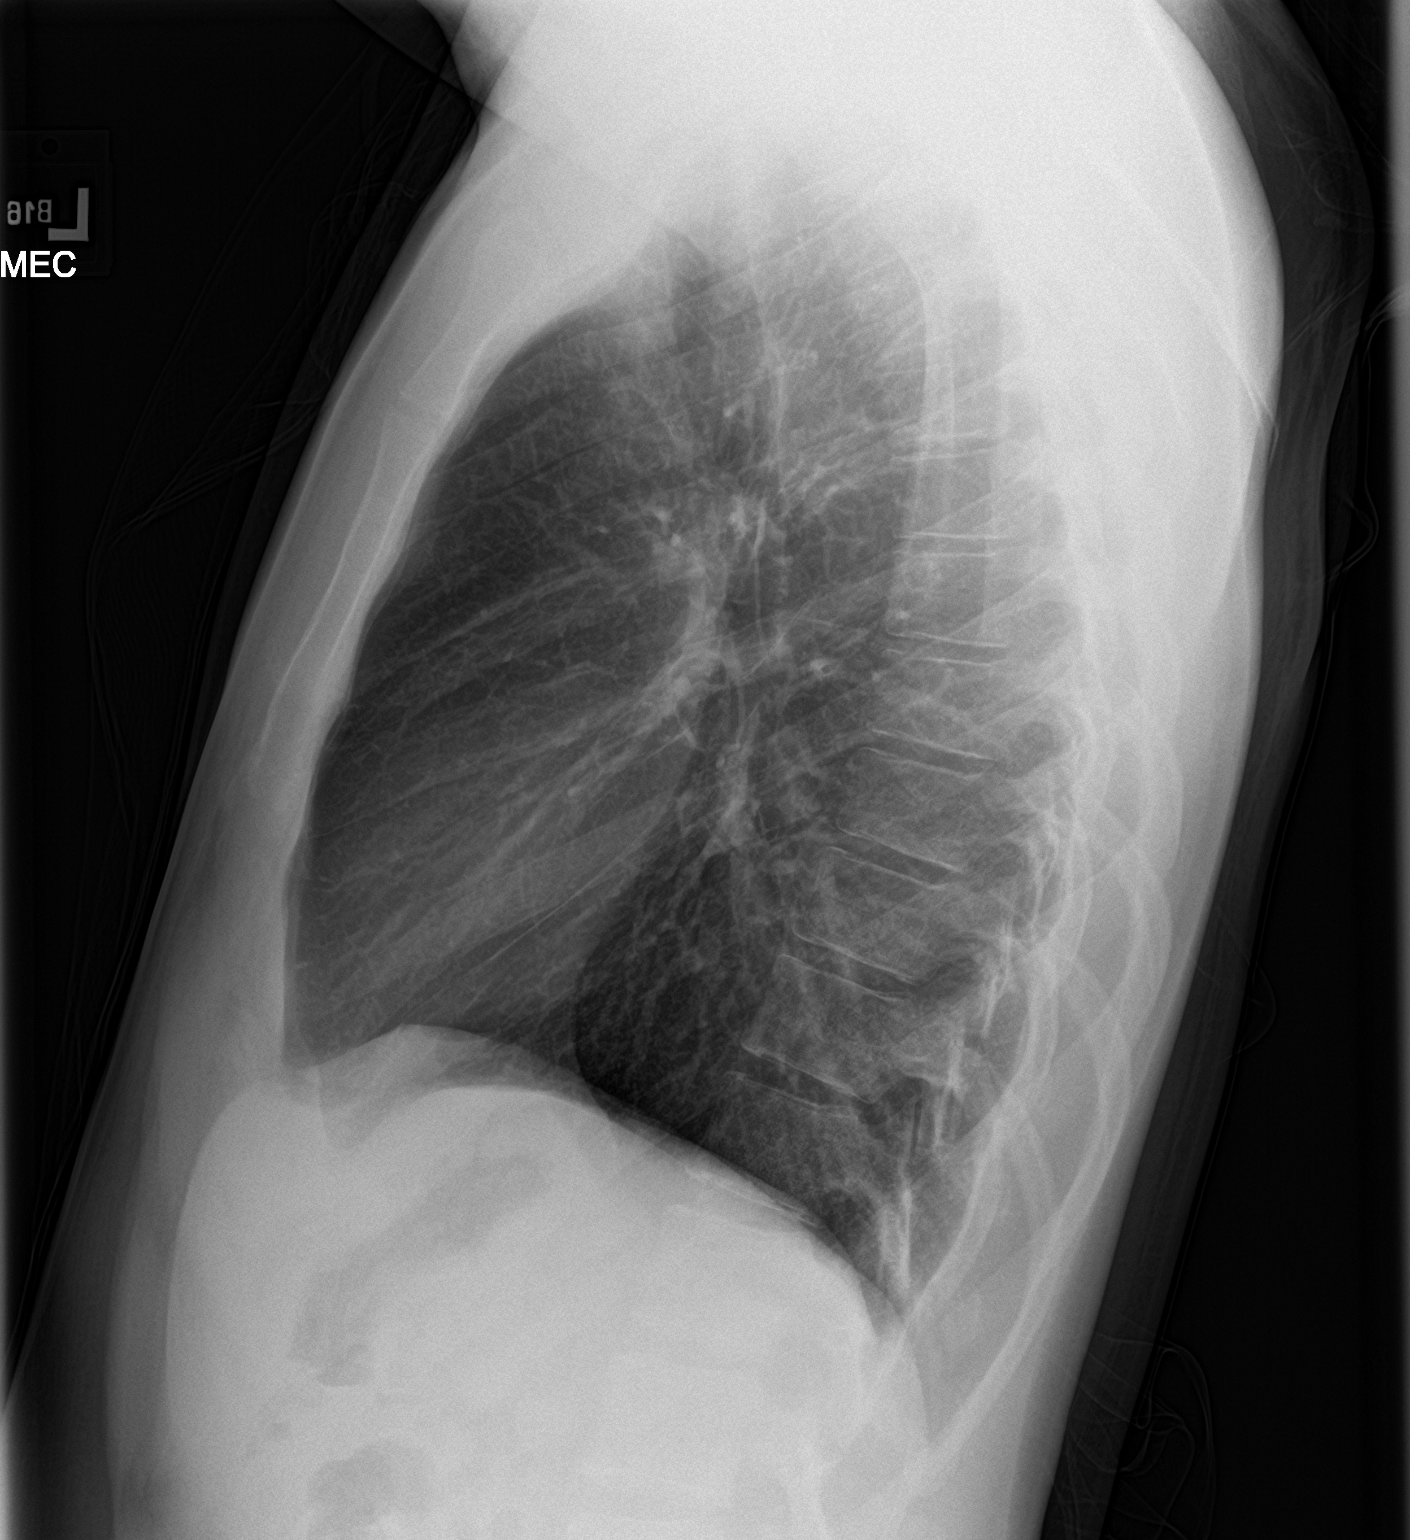

[2 of 2 positions shown; findings below may reference images not displayed]

FINDINGS: No pneumothorax. The heart, hila, and mediastinum are normal. No
pulmonary nodules or masses. No focal infiltrates.
IMPRESSION: No active cardiopulmonary disease.

## 2022-06-20 ENCOUNTER — Other Ambulatory Visit: Payer: Self-pay

## 2022-06-20 ENCOUNTER — Encounter: Payer: Self-pay | Admitting: Emergency Medicine

## 2022-06-20 ENCOUNTER — Emergency Department
Admission: EM | Admit: 2022-06-20 | Discharge: 2022-06-20 | Disposition: A | Discharge status: Home / Self Care | Payer: Self-pay | Attending: Emergency Medicine | Admitting: Emergency Medicine

## 2022-06-20 DIAGNOSIS — K029 Dental caries, unspecified: Secondary | ICD-10-CM | POA: Insufficient documentation

## 2022-06-20 DIAGNOSIS — K0889 Other specified disorders of teeth and supporting structures: Secondary | ICD-10-CM

## 2022-06-20 MED ORDER — AMOXICILLIN 500 MG PO CAPS: 500.0000 mg | ORAL_CAPSULE | Freq: Three times a day (TID) | ORAL | 0 refills | Status: AC

## 2022-06-20 MED ORDER — NAPROXEN 500 MG PO TABS
250.0000 mg | ORAL_TABLET | Freq: Once | ORAL | Status: AC
  Administered 2022-06-20: 250 mg via ORAL
  Filled 2022-06-20: qty 1

## 2022-06-20 MED ORDER — AMOXICILLIN 500 MG PO CAPS
500.0000 mg | ORAL_CAPSULE | Freq: Once | ORAL | Status: AC
  Administered 2022-06-20: 500 mg via ORAL
  Filled 2022-06-20: qty 1

## 2022-06-20 MED ORDER — NAPROXEN 250 MG PO TABS: 500.0000 mg | ORAL_TABLET | Freq: Two times a day (BID) | ORAL | 0 refills | Status: AC

## 2022-06-20 NOTE — ED Provider Notes (Signed)
[facility] Provider Note    Event Date/Time   First MD Initiated Contact with Patient 06/20/22 [phone]     (approximate)   History   Dental Pain   HPI  Bernard Jackson is a 31 y.o. male with no significant past medical history presents with dental pain.  Symptoms started last night.  Pain is in the left upper molar.  Has had pain in this tooth before that had come and gone but has not been this severe.  Denies facial swelling or trismus.  No fevers or chills.  Has not taken anything over-the-counter for it.  Has not seen a dentist recently and does not have a dentist.     History reviewed. No pertinent past medical history.  There are no problems to display for this patient.    Physical Exam  Triage Vital Signs: ED Triage Vitals  Enc Vitals Group     BP 06/20/22 0644 (!) 142/77     Pulse Rate 06/20/22 0644 64     Resp 06/20/22 0644 20     Temp 06/20/22 0644 99.1 F (37.3 C)     Temp Source 06/20/22 0644 Oral     SpO2 06/20/22 0644 98 %     Weight 06/20/22 0645 160 lb (72.6 kg)     Height 06/20/22 0645 5\' 6"  (1.676 m)     Head Circumference --      Peak Flow --      Pain Score 06/20/22 0646 10     Pain Loc --      Pain Edu? --      Excl. in [place]? --     Most recent vital signs: Vitals:   06/20/22 0644  BP: (!) 142/77  Pulse: 64  Resp: 20  Temp: 99.1 F (37.3 C)  SpO2: 98%     General: Awake, no distress.  CV:  Good peripheral perfusion.  Resp:  Normal effort.  Abd:  No distention.  Neuro:             Awake, Alert, Oriented x 3  Other:  No facial swelling, no trismus Left first upper molar has dental carry and it is tender to percussion there is mild swelling in the buccal mucosa but no fluctuance   ED Results / Procedures / Treatments  Labs (all labs ordered are listed, but only abnormal results are displayed) Labs Reviewed - No data to display   EKG     RADIOLOGY    PROCEDURES:  Critical Care performed:  No  Procedures    MEDICATIONS ORDERED IN ED: Medications  naproxen (NAPROSYN) tablet 250 mg (has no administration in time range)  amoxicillin (AMOXIL) capsule 500 mg (has no administration in time range)     IMPRESSION / MDM / ASSESSMENT AND PLAN / ED COURSE  I reviewed the triage vital signs and the nursing notes.                              Patient's presentation is most consistent with acute, uncomplicated illness.  Differential diagnosis includes, but is not limited to, dental caries pulpitis, periapical abscess    31 year old male presents with left maxillary tooth pain.  This started last night.  He has no associated facial swelling or trismus.  No fevers or systemic symptoms.  On exam he has a dental carry of the first maxillary molar on the left that is very tender to percussion.  There is a small amount of swelling along the buccal fold but no obvious fluctuance or area for me to drain.  Suspect periapical abscess versus pulpitis.  We will treat with a course of amoxicillin and naproxen.  I encouraged dental follow-up.  Patient is appropriate for discharge.  FINAL CLINICAL IMPRESSION(S) / ED DIAGNOSES   Final diagnoses:  Pain, dental     Rx / DC Orders   ED Discharge Orders          Ordered    naproxen (NAPROSYN) 250 MG tablet  2 times daily with meals        06/20/22 0720    amoxicillin (AMOXIL) 500 MG capsule  3 times daily        06/20/22 0720             Note:  This document was prepared using Dragon voice recognition software and may include unintentional dictation errors.   Georga Hacking, MD 06/20/22 [phone]

## 2022-06-20 NOTE — ED Triage Notes (Signed)
Pt arrived via POV with reports of dental pain since last night, unable to sleep due to pain. Bottom L side painful

## 2022-06-20 NOTE — ED Notes (Signed)
See triage note. Presents with dental pain to left upper gum line  Min swelling noted to gum  No fever

## 2022-06-20 NOTE — Discharge Instructions (Signed)
Please take the antibiotic 3 times a day for the next 7 days.  You can take the naproxen twice a day for pain.  Please follow-up with the dentist as you will likely need a root canal or other intervention
